# Patient Record
Sex: Female | Born: 1975 | Race: Black or African American | Hispanic: No | Marital: Single
Health system: Southern US, Community
[De-identification: ages and names within clinical notes are randomized; demographics above are authoritative.]

---

## 2006-02-26 ENCOUNTER — Emergency Department: Payer: Self-pay | Admitting: Emergency Medicine

## 2007-10-25 ENCOUNTER — Emergency Department: Payer: Self-pay | Admitting: Emergency Medicine

## 2007-10-28 ENCOUNTER — Emergency Department: Payer: Self-pay | Admitting: Unknown Physician Specialty

## 2008-06-15 ENCOUNTER — Emergency Department: Payer: Self-pay | Admitting: Emergency Medicine

## 2012-08-14 ENCOUNTER — Ambulatory Visit: Payer: Self-pay | Admitting: Family Medicine

## 2012-09-26 ENCOUNTER — Ambulatory Visit: Payer: Self-pay | Admitting: Family Medicine

## 2015-01-08 ENCOUNTER — Other Ambulatory Visit: Payer: Self-pay | Admitting: General Practice

## 2015-01-08 ENCOUNTER — Ambulatory Visit
Admission: RE | Admit: 2015-01-08 | Discharge: 2015-01-08 | Disposition: A | Discharge status: Home / Self Care | Payer: Disability Insurance | Source: Ambulatory Visit | Attending: General Practice | Admitting: General Practice

## 2015-01-08 DIAGNOSIS — M25552 Pain in left hip: Secondary | ICD-10-CM | POA: Diagnosis present

## 2015-01-08 DIAGNOSIS — M7989 Other specified soft tissue disorders: Secondary | ICD-10-CM

## 2015-01-08 DIAGNOSIS — Z9889 Other specified postprocedural states: Secondary | ICD-10-CM | POA: Insufficient documentation

## 2015-01-08 DIAGNOSIS — M79605 Pain in left leg: Secondary | ICD-10-CM

## 2015-01-08 DIAGNOSIS — S62101A Fracture of unspecified carpal bone, right wrist, initial encounter for closed fracture: Secondary | ICD-10-CM

## 2015-01-08 DIAGNOSIS — M7732 Calcaneal spur, left foot: Secondary | ICD-10-CM | POA: Diagnosis not present

## 2015-01-08 DIAGNOSIS — S72392D Other fracture of shaft of left femur, subsequent encounter for closed fracture with routine healing: Secondary | ICD-10-CM | POA: Diagnosis not present

## 2015-01-08 DIAGNOSIS — M1712 Unilateral primary osteoarthritis, left knee: Secondary | ICD-10-CM | POA: Diagnosis not present

## 2015-05-06 ENCOUNTER — Ambulatory Visit: Payer: Medicaid Other | Attending: Specialist

## 2015-05-06 DIAGNOSIS — G4733 Obstructive sleep apnea (adult) (pediatric): Secondary | ICD-10-CM | POA: Insufficient documentation

## 2015-05-26 ENCOUNTER — Ambulatory Visit: Payer: Medicaid Other | Attending: Specialist

## 2015-05-26 DIAGNOSIS — G4733 Obstructive sleep apnea (adult) (pediatric): Secondary | ICD-10-CM | POA: Insufficient documentation
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
## 2019-09-29 ENCOUNTER — Other Ambulatory Visit: Payer: Self-pay

## 2019-09-29 ENCOUNTER — Ambulatory Visit (LOCAL_COMMUNITY_HEALTH_CENTER): Payer: Self-pay

## 2019-09-29 DIAGNOSIS — Z111 Encounter for screening for respiratory tuberculosis: Secondary | ICD-10-CM

## 2019-09-29 NOTE — Progress Notes (Signed)
Desires Covid vaccine and escorted to Covid vaccine clinic. Jossie Ng, RN

## 2019-10-02 ENCOUNTER — Other Ambulatory Visit: Payer: Self-pay

## 2019-10-02 ENCOUNTER — Ambulatory Visit (LOCAL_COMMUNITY_HEALTH_CENTER): Payer: Self-pay

## 2019-10-02 DIAGNOSIS — Z111 Encounter for screening for respiratory tuberculosis: Secondary | ICD-10-CM

## 2019-10-02 LAB — TB SKIN TEST
Induration: 0 mm
TB Skin Test: NEGATIVE

## 2019-11-07 DEATH — deceased
